# Patient Record
Sex: Male | Born: 1995 | Race: Black or African American | Hispanic: No | Marital: Single
Health system: Southern US, Community
[De-identification: ages and names within clinical notes are randomized; demographics above are authoritative.]

## PROBLEM LIST (undated history)

## (undated) DIAGNOSIS — J45909 Unspecified asthma, uncomplicated: Secondary | ICD-10-CM

---
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
## 2016-01-25 ENCOUNTER — Telehealth: Payer: Self-pay | Admitting: *Deleted

## 2016-01-25 NOTE — Telephone Encounter (Signed)
No response to phone or letter regarding (+)GC.  Treated at ER visit.

## 2016-03-02 ENCOUNTER — Encounter (HOSPITAL_COMMUNITY): Payer: Self-pay | Admitting: Emergency Medicine

## 2016-03-02 ENCOUNTER — Ambulatory Visit (HOSPITAL_COMMUNITY)
Admission: EM | Admit: 2016-03-02 | Discharge: 2016-03-02 | Disposition: A | Discharge status: Home / Self Care | Payer: No Typology Code available for payment source | Attending: Family Medicine | Admitting: Family Medicine

## 2016-03-02 DIAGNOSIS — Z79899 Other long term (current) drug therapy: Secondary | ICD-10-CM | POA: Insufficient documentation

## 2016-03-02 DIAGNOSIS — J45909 Unspecified asthma, uncomplicated: Secondary | ICD-10-CM | POA: Insufficient documentation

## 2016-03-02 DIAGNOSIS — J029 Acute pharyngitis, unspecified: Secondary | ICD-10-CM | POA: Insufficient documentation

## 2016-03-02 LAB — POCT RAPID STREP A: STREPTOCOCCUS, GROUP A SCREEN (DIRECT): NEGATIVE

## 2016-03-02 MED ORDER — IPRATROPIUM BROMIDE 0.06 % NA SOLN: 2.0000 | Freq: Four times a day (QID) | NASAL | 1 refills | Status: AC

## 2016-03-02 MED ORDER — AMOXICILLIN 500 MG PO CAPS: 500.0000 mg | ORAL_CAPSULE | Freq: Three times a day (TID) | ORAL | 0 refills | Status: AC

## 2016-03-02 NOTE — ED Triage Notes (Signed)
The patient presented to the [facility] with a complaint of a sore throat x 1 week.  

## 2016-03-02 NOTE — Discharge Instructions (Signed)
Drink lots of fluids, take all of medicine, use lozenges as needed.return if needed °

## 2016-03-02 NOTE — ED Provider Notes (Addendum)
[facility]    CSN: [id number] Arrival date & time: 03/02/16  [id number]  First Provider Contact:  First MD Initiated Contact with Patient 03/02/16 1702        History   Chief Complaint Chief Complaint  Patient presents with  . Sore Throat    HPI Justin Larson is a 20 y.o. male.    Sore Throat  This is a new problem. The current episode started more than 2 days ago. The problem has been gradually worsening. Pertinent negatives include no chest pain and no abdominal pain. The symptoms are aggravated by swallowing.    Past Medical History:  Diagnosis Date  . Asthma     There are no active problems to display for this patient.   History reviewed. No pertinent surgical history.     Home Medications    Prior to Admission medications   Medication Sig Start Date End Date Taking? Authorizing Provider  albuterol (PROVENTIL HFA;VENTOLIN HFA) 108 (90 BASE) MCG/ACT inhaler Inhale 2 puffs into the lungs every 6 (six) hours as needed. For wheezing    Historical Provider, MD  albuterol (PROVENTIL) (2.5 MG/3ML) 0.083% nebulizer solution Take 2.5 mg by nebulization every 6 (six) hours as needed. For wheezing    Historical Provider, MD  cetirizine (ZYRTEC) 10 MG chewable tablet Chew 10 mg by mouth at bedtime.    Historical Provider, MD  diphenhydrAMINE (BENADRYL) 25 mg capsule Take 1 capsule (25 mg total) by mouth every 6 (six) hours as needed for itching. 02/25/12 03/06/12  Tatyana Kirichenko, PA-C  EPINEPHrine (EPIPEN) 0.3 mg/0.3 mL DEVI Inject 0.3 mLs (0.3 mg total) into the muscle as needed. 02/25/12   Tatyana Kirichenko, PA-C  ibuprofen (ADVIL,MOTRIN) 600 MG tablet Take 1 tablet (600 mg total) by mouth every 6 (six) hours as needed. 11/14/14   Junius FinnerErin O'Malley, PA-C  montelukast (SINGULAIR) 10 MG tablet Take 10 mg by mouth at bedtime.    Historical Provider, MD  predniSONE (DELTASONE) 20 MG tablet Take 2 tablets (40 mg total) by mouth daily. 02/25/12   Tatyana Kirichenko, PA-C    traMADol (ULTRAM) 50 MG tablet Take 1 tablet (50 mg total) by mouth every 6 (six) hours as needed. 11/14/14   Junius FinnerErin O'Malley, PA-C    Family History History reviewed. No pertinent family history.  Social History Social History  Substance Use Topics  . Smoking status: Passive Smoke Exposure - Never Smoker  . Smokeless tobacco: Never Used  . Alcohol use No     Allergies   Bee venom   Review of Systems Review of Systems  Constitutional: Negative.  Negative for chills and fever.  HENT: Positive for congestion, postnasal drip, rhinorrhea and sore throat. Negative for dental problem.   Cardiovascular: Negative for chest pain.  Gastrointestinal: Negative for abdominal pain.  All other systems reviewed and are negative.    Physical Exam Triage Vital Signs ED Triage Vitals  Enc Vitals Group     BP 03/02/16 1615 126/78     Pulse Rate 03/02/16 1615 87     Resp 03/02/16 1615 18     Temp 03/02/16 1615 98.9 F (37.2 C)     Temp Source 03/02/16 1615 Oral     SpO2 03/02/16 1615 100 %     Weight --      Height --      Head Circumference --      Peak Flow --      Pain Score 03/02/16 1625 8  Pain Loc --      Pain Edu? --      Excl. in GC? --    No data found.   Updated Vital Signs BP 126/78 (BP Location: Left Arm)   Pulse 87   Temp 98.9 F (37.2 C) (Oral)   Resp 18   SpO2 100%   Visual Acuity Right Eye Distance:   Left Eye Distance:   Bilateral Distance:    Right Eye Near:   Left Eye Near:    Bilateral Near:     Physical Exam  Constitutional: He appears well-developed and well-nourished. No distress.  HENT:  Right Ear: External ear normal.  Left Ear: External ear normal.  Nose: Nose normal.  Mouth/Throat: Uvula is midline and mucous membranes are normal. Posterior oropharyngeal edema and posterior oropharyngeal erythema present.  Eyes: Conjunctivae and EOM are normal. Pupils are equal, round, and reactive to light.  Neck: Normal range of motion. Neck  supple.  Lymphadenopathy:    He has cervical adenopathy.  Nursing note and vitals reviewed.    UC Treatments / Results  Labs (all labs ordered are listed, but only abnormal results are displayed) Labs Reviewed  POCT RAPID STREP A    EKG  EKG Interpretation None       Radiology No results found.  Procedures Procedures (including critical care time)  Medications Ordered in UC Medications - No data to display   Initial Impression / Assessment and Plan / UC Course  I have reviewed the triage vital signs and the nursing notes.  Pertinent labs & imaging results that were available during my care of the patient were reviewed by me and considered in my medical decision making (see chart for details).  Clinical Course      Final Clinical Impressions(s) / UC Diagnoses   Final diagnoses:  None    New Prescriptions New Prescriptions   No medications on file     Linna HoffJames D Reubin Bushnell, MD 03/02/16 1713    Linna HoffJames D Loney Peto, MD 03/02/16 [phone]

## 2016-03-05 LAB — CULTURE, GROUP A STREP (THRC)
